# Patient Record
Sex: Female | Born: 1960 | Race: Black or African American | Hispanic: No | Marital: Single | Smoking: Current every day smoker
Health system: Southern US, Community
[De-identification: ages and names within clinical notes are randomized; demographics above are authoritative.]

## PROBLEM LIST (undated history)

## (undated) DIAGNOSIS — I10 Essential (primary) hypertension: Secondary | ICD-10-CM

---

## 2007-08-19 ENCOUNTER — Ambulatory Visit (HOSPITAL_BASED_OUTPATIENT_CLINIC_OR_DEPARTMENT_OTHER): Admission: RE | Admit: 2007-08-19 | Discharge: 2007-08-19 | Payer: Self-pay | Admitting: *Deleted

## 2007-08-19 ENCOUNTER — Encounter (INDEPENDENT_AMBULATORY_CARE_PROVIDER_SITE_OTHER): Payer: Self-pay | Admitting: *Deleted

## 2010-09-02 ENCOUNTER — Emergency Department (HOSPITAL_COMMUNITY): Admission: EM | Admit: 2010-09-02 | Discharge: 2010-09-02 | Payer: Self-pay | Admitting: Emergency Medicine

## 2010-09-05 ENCOUNTER — Emergency Department (HOSPITAL_COMMUNITY): Admission: EM | Admit: 2010-09-05 | Discharge: 2010-09-05 | Payer: Self-pay | Admitting: Emergency Medicine

## 2011-01-15 LAB — CBC
MCH: 28.4 pg (ref 26.0–34.0)
MCHC: 33.8 g/dL (ref 30.0–36.0)
MCV: 84.2 fL (ref 78.0–100.0)
Platelets: 260 10*3/uL (ref 150–400)
RBC: 4.62 MIL/uL (ref 3.87–5.11)

## 2011-01-15 LAB — URINALYSIS, ROUTINE W REFLEX MICROSCOPIC
Bilirubin Urine: NEGATIVE
Ketones, ur: NEGATIVE mg/dL
Nitrite: NEGATIVE
Protein, ur: NEGATIVE mg/dL
pH: 6 (ref 5.0–8.0)

## 2011-01-15 LAB — DIFFERENTIAL
Basophils Relative: 1 % (ref 0–1)
Eosinophils Absolute: 0.1 10*3/uL (ref 0.0–0.7)
Eosinophils Relative: 2 % (ref 0–5)
Lymphs Abs: 2.9 10*3/uL (ref 0.7–4.0)
Monocytes Relative: 5 % (ref 3–12)

## 2011-01-15 LAB — POCT I-STAT, CHEM 8
Creatinine, Ser: 0.9 mg/dL (ref 0.4–1.2)
Glucose, Bld: 105 mg/dL — ABNORMAL HIGH (ref 70–99)
HCT: 42 % (ref 36.0–46.0)
Hemoglobin: 14.3 g/dL (ref 12.0–15.0)
Sodium: 139 mEq/L (ref 135–145)
TCO2: 26 mmol/L (ref 0–100)

## 2011-03-19 NOTE — Op Note (Signed)
NAMELAEL, WETHERBEE                 ACCOUNT NO.:  [id number]   MEDICAL RECORD NO.:  [id number]          PATIENT TYPE:  AMB   LOCATION:  DSC                          FACILITY:  [facility]   PHYSICIAN:  Tennis Must Meyerdierks, M.D.DATE OF BIRTH:  1961-06-29   DATE OF PROCEDURE:  08/19/2007  DATE OF DISCHARGE:  08/19/2007                               OPERATIVE REPORT   PREOPERATIVE DIAGNOSIS:  Mass, right thumb.   POSTOPERATIVE DIAGNOSIS:  Mass, right thumb.   PROCEDURE:  Excision of mass, right thumb.   SURGEON:  Lowell Bouton, M.D.   ANESTHESIA:  General.   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  There was some erosion through the skin ulnarly but no gross purulent  material was obtained there.   PROCEDURE IN DETAIL:  Under general anesthesia, with a tourniquet on the  right arm, the right hand was prepped and draped in usual fashion.  After exsanguinating the limb, the tourniquet was inflated to 250 mmHg.  A Y-shaped incision was made over the dorsum of the IP joint and carried  down through the subcutaneous tissues.  Bleeding points were coagulated.  Sharp dissection was carried down to the tumor which was encapsulated.  It was sharply dissected out away from the extensor tendon and the  joint.  The joint was not entered.  After dissecting it out ulnarly,  the radial half of it was dissected out.  The ulnar side was where the  opening was that appeared to be purulent; however, there was no gross  infection.  After completely excising the mass, the wound was irrigated  with saline.  The skin was closed with 4-0 nylon sutures.  Marcaine,  0.5%, was inserted in a digital block for pain control.  Sterile  dressings were applied.  The patient went to the recovery room awake, in  stable and good condition.      Lowell Bouton, M.D.  Electronically  Signed     EMM/MEDQ  D:  08/19/2007  T:  08/20/2007  Job:  [id number]   cc:   Candyce Churn. Allyne Gee, M.D.

## 2011-08-15 LAB — BASIC METABOLIC PANEL
BUN: 7
CO2: 24
Chloride: 105
Creatinine, Ser: 0.83
Glucose, Bld: 111 — ABNORMAL HIGH

## 2011-09-11 ENCOUNTER — Other Ambulatory Visit: Payer: Self-pay | Admitting: Internal Medicine

## 2011-09-11 DIAGNOSIS — Z1231 Encounter for screening mammogram for malignant neoplasm of breast: Secondary | ICD-10-CM

## 2011-10-07 ENCOUNTER — Ambulatory Visit
Admission: RE | Admit: 2011-10-07 | Discharge: 2011-10-07 | Disposition: A | Discharge status: Home / Self Care | Payer: BC Managed Care – PPO | Source: Ambulatory Visit | Attending: Internal Medicine | Admitting: Internal Medicine

## 2011-10-07 DIAGNOSIS — Z1231 Encounter for screening mammogram for malignant neoplasm of breast: Secondary | ICD-10-CM

## 2012-03-12 ENCOUNTER — Emergency Department (HOSPITAL_COMMUNITY)
Admission: EM | Admit: 2012-03-12 | Discharge: 2012-03-12 | Disposition: A | Discharge status: Home / Self Care | Payer: Self-pay | Attending: Emergency Medicine | Admitting: Emergency Medicine

## 2012-03-12 ENCOUNTER — Emergency Department (HOSPITAL_COMMUNITY): Payer: Self-pay

## 2012-03-12 ENCOUNTER — Encounter (HOSPITAL_COMMUNITY): Payer: Self-pay

## 2012-03-12 DIAGNOSIS — M549 Dorsalgia, unspecified: Secondary | ICD-10-CM | POA: Insufficient documentation

## 2012-03-12 DIAGNOSIS — R10819 Abdominal tenderness, unspecified site: Secondary | ICD-10-CM | POA: Insufficient documentation

## 2012-03-12 DIAGNOSIS — M545 Low back pain, unspecified: Secondary | ICD-10-CM | POA: Insufficient documentation

## 2012-03-12 DIAGNOSIS — I10 Essential (primary) hypertension: Secondary | ICD-10-CM | POA: Insufficient documentation

## 2012-03-12 DIAGNOSIS — R109 Unspecified abdominal pain: Secondary | ICD-10-CM | POA: Insufficient documentation

## 2012-03-12 DIAGNOSIS — K8020 Calculus of gallbladder without cholecystitis without obstruction: Secondary | ICD-10-CM | POA: Insufficient documentation

## 2012-03-12 HISTORY — DX: Essential (primary) hypertension: I10

## 2012-03-12 LAB — CBC
MCH: 26.4 pg (ref 26.0–34.0)
MCHC: 32.6 g/dL (ref 30.0–36.0)
MCV: 80.9 fL (ref 78.0–100.0)
Platelets: 263 10*3/uL (ref 150–400)
RDW: 15.6 % — ABNORMAL HIGH (ref 11.5–15.5)

## 2012-03-12 LAB — WET PREP, GENITAL: Trich, Wet Prep: NONE SEEN

## 2012-03-12 LAB — URINALYSIS, ROUTINE W REFLEX MICROSCOPIC
Bilirubin Urine: NEGATIVE
Leukocytes, UA: NEGATIVE
Nitrite: NEGATIVE
Specific Gravity, Urine: 1.01 (ref 1.005–1.030)
pH: 6 (ref 5.0–8.0)

## 2012-03-12 LAB — COMPREHENSIVE METABOLIC PANEL
ALT: 9 U/L (ref 0–35)
Calcium: 9.6 mg/dL (ref 8.4–10.5)
GFR calc Af Amer: 72 mL/min — ABNORMAL LOW (ref >90)
Glucose, Bld: 82 mg/dL (ref 70–99)
Sodium: 139 mEq/L (ref 135–145)
Total Protein: 7 g/dL (ref 6.0–8.3)

## 2012-03-12 LAB — DIFFERENTIAL
Basophils Absolute: 0 10*3/uL (ref 0.0–0.1)
Basophils Relative: 0 % (ref 0–1)
Eosinophils Absolute: 0.1 10*3/uL (ref 0.0–0.7)
Eosinophils Relative: 1 % (ref 0–5)
Lymphs Abs: 3.1 10*3/uL (ref 0.7–4.0)

## 2012-03-12 MED ORDER — IBUPROFEN 600 MG PO TABS: 600.0000 mg | ORAL_TABLET | Freq: Four times a day (QID) | ORAL | Status: AC | PRN

## 2012-03-12 NOTE — ED Notes (Signed)
Pt compalins of lower back pain ongoing and progressively getting worse, sts had sex Monday night and that may have caused it.

## 2012-03-12 NOTE — ED Provider Notes (Signed)
History   This chart was scribed for [name], MD by [name]. The patient was seen in room STRE7/STRE7 and the patient's care was started at 1:20PM.    CSN: [id number]  Arrival date & time 03/12/12  1220   None     Chief Complaint  Patient presents with  . Back Pain    (Consider location/radiation/quality/duration/timing/severity/associated sxs/prior treatment) HPI  Alyssa Lofts, RN 03/12/2012 12:26  Pt compalins of lower back pain ongoing and progressively getting worse, sts had sex Monday night and that may have caused it.   Alyssa Lozano is a 51 y.o. female who presents to the Emergency Department complaining of constant, moderate to severe lower back pain with an onset 2 days ago that is progressively getting worse. 3/10 currently with min radiation to RLQ/Rt flank. Pt states that she had sexual intercourse a day before onset which may have caused the present pain. No Hx of lower back pain; pt has never experienced like this before. Laying down in bed/being in bed for long periods of time such as during sleep aggravates the pain. Pt also stated that she is smelling a vaginal d/c, but not seeing it. Abd pain present. No urinary problems. No HA, fever, neck pain, sore throat, rash, CP, SOB, n/v/d, dysuria, or extremity pain, edema, weakness, numbness, or tingling. No urinary incont or retention. Hx of HTN. No known allergies. No other pertinent medical symptoms. Denies hematuria/dysuria/freq/urgency.   No h/o abd surgeries. No trauma.  Past Medical History  Diagnosis Date  . Hypertension     History reviewed. No pertinent past surgical history.  History reviewed. No pertinent family history.  History  Substance Use Topics  . Smoking status: Current Everyday Smoker  . Smokeless tobacco: Not on file  . Alcohol Use: No    OB History    Grav Para Term Preterm Abortions TAB SAB Ect Mult Living                  Review of Systems 10 Systems reviewed and all are  negative for acute change except as noted in the HPI.   Allergies  Review of patient's allergies indicates no known allergies.  Home Medications   Current Outpatient Rx  Name Route Sig Dispense Refill  . BC HEADACHE POWDER PO Oral Take 1 packet by mouth daily as needed. For pain    . BUPROPION HCL 75 MG PO TABS Oral Take 75 mg by mouth 2 (two) times daily.    Marland Kitchen LOSARTAN POTASSIUM-HCTZ 100-25 MG PO TABS Oral Take 1 tablet by mouth daily.    . IBUPROFEN 600 MG PO TABS Oral Take 1 tablet (600 mg total) by mouth every 6 (six) hours as needed for pain. 30 tablet 0    BP 148/83  Pulse 71  Temp(Src) 98.2 F (36.8 C) (Oral)  Resp 18  SpO2 98%  Physical Exam  Nursing note and vitals reviewed. Constitutional: She is oriented to person, place, and time. She appears well-developed and well-nourished. No distress.  HENT:  Head: Normocephalic and atraumatic.  Eyes: EOM are normal. Pupils are equal, round, and reactive to light.  Neck: Normal range of motion. Neck supple. No tracheal deviation present.  Cardiovascular: Normal rate, regular rhythm and normal heart sounds.  Exam reveals no gallop and no friction rub.   No murmur heard. Pulmonary/Chest: Effort normal and breath sounds normal. No respiratory distress. She has no wheezes. She has no rales.  Abdominal: Soft. There is tenderness (Mild suprapubic  tenderness).  Musculoskeletal: Normal range of motion. She exhibits no edema and no tenderness.       No cva tenderness; no ttp lumbar paraspinal tenderness  Neurological: She is alert and oriented to person, place, and time.  Skin: Skin is warm and dry. No rash noted.  Psychiatric: She has a normal mood and affect. Her behavior is normal.    ED Course  Procedures (including critical care time)  DIAGNOSTIC STUDIES: Oxygen Saturation is 98% on room air, normal by my interpretation.    COORDINATION OF CARE:  1:24PM - EDMD will order abd CT for the pt; refuses pain meds; EDMD will  also perform pelvic exam 2:10PM - EDMD performed the pelvic exam   Labs Reviewed  CBC - Abnormal; Notable for the following:    Hemoglobin 11.2 (*)    HCT 34.4 (*)    RDW 15.6 (*)    All other components within normal limits  COMPREHENSIVE METABOLIC PANEL - Abnormal; Notable for the following:    Alkaline Phosphatase 126 (*)    Total Bilirubin 0.2 (*)    GFR calc non Af Amer 62 (*)    GFR calc Af Amer 72 (*)    All other components within normal limits  WET PREP, GENITAL - Abnormal; Notable for the following:    Clue Cells Wet Prep HPF POC FEW (*)    WBC, Wet Prep HPF POC FEW (*)    All other components within normal limits  DIFFERENTIAL  URINALYSIS, ROUTINE W REFLEX MICROSCOPIC  GC/CHLAMYDIA PROBE AMP, GENITAL   Ct Abdomen Pelvis Wo Contrast  03/12/2012  *RADIOLOGY REPORT*  Clinical Data: Moderate to severe low back pain.  Onset 2 days ago.  CT ABDOMEN AND PELVIS WITHOUT CONTRAST  Technique:  Multidetector CT imaging of the abdomen and pelvis was performed following the standard protocol without intravenous contrast.  Comparison: None.  Findings: Lung bases show dependent atelectasis.  Noncontrast appearance of the liver is within normal limits.  The ureters appear within normal limits.  There are no left renal calculi.  The left adrenal gland shows a small adenoma best seen on coronal imaging.  The right adrenal gland appears normal.  Nonobstructing 3 mm right inferior pole renal collecting system calculus.  Aorto iliofemoral atherosclerosis.  Uterus and adnexa appear normal. Urinary bladder normal.  Cholelithiasis is present.  In the tip of the gallbladder fundus, there are circumferential calcifications.  This may represent a peripherally calcified gallstone, particularly in the setting of other stones however calcified gallbladder neoplasm is not excluded.  There is no hepatic invasion.  There is no CT evidence of acute cholecystitis.  Normal appendix identified.  Prominent stool is  present in the ascending colon.  Urinary bladder appears normal.  No adenopathy.  No aggressive osseous lesions are identified. L4-L5 facet arthrosis with borderline anterolisthesis.  IMPRESSION: 1.  No acute abnormality. 2.  Nonobstructing right inferior pole 3 mm renal calculus. No ureteral calculi or hydronephrosis. 3.  Cholelithiasis. 4.  Calcification in the inferior gallbladder fundus likely represents calcified gallstone.  Porcelain gallbladder / gallbladder neoplasm cannot be not excluded. Follow up right upper quadrant ultrasound is recommended to assess for mobility or surgery consultation for cholecystectomy.  Discussed with Dr. Hyman Hopes. 5.  Small left adrenal adenoma.  Original Report Authenticated By: Andreas Newport, M.D.    1. Back pain   2. Flank pain   3. Gallstone     MDM  Back pain with radiation to RLQ/Rt flank. No reproducible on  exam. Neurovasc intact. No concern for cauda equina.  Non obstructive renal stones noted. She has calcificed gallstone and will have outpatient U/S to evaluate for porc gallbladder to assess her risk for cancer. She has been given referral to general surgery. She has no RUQ pain. Appears well +clue cells on wet prep but no itching, therefore will not treat. Gc/chl culture pending. Declining pain medication. Will f/u with her primary care doctor, f/u U/S GB and general surgery as needed. No EMC precluding discharge at this time. Given Precautions for return.    I personally performed the services described in this documentation, which was scribed in my presence. The recorded information has been reviewed and considered.          [name], MD 03/12/12 1510

## 2012-03-12 NOTE — Discharge Instructions (Signed)
Follow up with your doctor as discussed. You will need an outpatient ultrasound of your gallbladder to assess your risk for cancer. If for any reason you have difficulty obtaining your ultrasound you may call for appointment, otherwise, the department should call you as outpatient to schedule. Return for worsening pain or if you are concerned.  Back Pain, Adult Low back pain is very common. About 1 in 5 people have back pain.The cause of low back pain is rarely dangerous. The pain often gets better over time.About half of people with a sudden onset of back pain feel better in just 2 weeks. About 8 in 10 people feel better by 6 weeks.  CAUSES Some common causes of back pain include:  Strain of the muscles or ligaments supporting the spine.   Wear and tear (degeneration) of the spinal discs.   Arthritis.   Direct injury to the back.  DIAGNOSIS Most of the time, the direct cause of low back pain is not known.However, back pain can be treated effectively even when the exact cause of the pain is unknown.Answering your caregiver's questions about your overall health and symptoms is one of the most accurate ways to make sure the cause of your pain is not dangerous. If your caregiver needs more information, he or she may order lab work or imaging tests (X-rays or MRIs).However, even if imaging tests show changes in your back, this usually does not require surgery. HOME CARE INSTRUCTIONS For many people, back pain returns.Since low back pain is rarely dangerous, it is often a condition that people can learn to [facility] their own.   Remain active. It is stressful on the back to sit or stand in one place. Do not sit, drive, or stand in one place for more than 30 minutes at a time. Take short walks on level surfaces as soon as pain allows.Try to increase the length of time you walk each day.   Do not stay in bed.Resting more than 1 or 2 days can delay your recovery.   Do not avoid exercise or  work.Your body is made to move.It is not dangerous to be active, even though your back may hurt.Your back will likely heal faster if you return to being active before your pain is gone.   Pay attention to your body when you bend and lift. Many people have less discomfortwhen lifting if they bend their knees, keep the load close to their bodies,and avoid twisting. Often, the most comfortable positions are those that put less stress on your recovering back.   Find a comfortable position to sleep. Use a firm mattress and lie on your side with your knees slightly bent. If you lie on your back, put a pillow under your knees.   Only take over-the-counter or prescription medicines as directed by your caregiver. Over-the-counter medicines to reduce pain and inflammation are often the most helpful.Your caregiver may prescribe muscle relaxant drugs.These medicines help dull your pain so you can more quickly return to your normal activities and healthy exercise.   Put ice on the injured area.   Put ice in a plastic bag.   Place a towel between your skin and the bag.   Leave the ice on for 15 to 20 minutes, 3 to 4 times a day for the first 2 to 3 days. After that, ice and heat may be alternated to reduce pain and spasms.   Ask your caregiver about trying back exercises and gentle massage. This may be of some  benefit.   Avoid feeling anxious or stressed.Stress increases muscle tension and can worsen back pain.It is important to recognize when you are anxious or stressed and learn ways to manage it.Exercise is a great option.  SEEK MEDICAL CARE IF:  You have pain that is not relieved with rest or medicine.   You have pain that does not improve in 1 week.   You have new symptoms.   You are generally not feeling well.  SEEK IMMEDIATE MEDICAL CARE IF:   You have pain that radiates from your back into your legs.   You develop new bowel or bladder control problems.   You have unusual  weakness or numbness in your arms or legs.   You develop nausea or vomiting.   You develop abdominal pain.   You feel faint.  Document Released: 10/21/2005 Document Revised: 10/10/2011 Document Reviewed: 03/11/2011 [facility] Patient Information 2012 [place], [place].  RESOURCE GUIDE  Dental Problems  Patients with Medicaid: [facility] [phone]                                           [phone] [address] Phone:  [phone]                                                   Phone:  [phone]  If unable to pay or uninsured, contact:  [facility] or [facility]. to become qualified for the adult dental clinic.  Chronic Pain Problems Contact [facility]  [phone] Patients need to be referred by their primary care doctor.  Insufficient Money for Medicine Contact [employer] Way:  call "211" or [facility] [phone].  No Primary Care Doctor Call [facility]  [phone] Other agencies that provide inexpensive medical care    [facility]  [phone]    [facility]  [phone]    [facility]  [phone]    [facility]  [phone]    [facility]  [phone]    [facility]  [phone]  Psychological Services [facility]  [phone] [facility]  [phone] [facility]   [phone] (emergency services [phone])  Abuse/Neglect [facility] Child Abuse Hotline 2795967458 [facility] Child Abuse Hotline 743 824 3118 (After Hours)  Emergency Shelter [facility] [phone]  Maternity Homes [facility] [phone] [facility] [phone]  MRSA Hotline #:   801-550-9772    [facility] Resources  [facility]  United [employer]                           [facility] Dept. [address] [place]                      [address]         [address]  [address]  Cristobal Goldmann Phone:  [phone]                                  Phone:  [phone]                   Phone:  [phone]  [facility] Phone:  [phone]  [facility] Child Abuse Hotline 712 794 5037 [phone] (After Hours)

## 2012-03-13 LAB — GC/CHLAMYDIA PROBE AMP, GENITAL: Chlamydia, DNA Probe: NEGATIVE

## 2013-02-02 IMAGING — CT CT ABD-PELV W/O CM
2 of 4 series · 14 of 32 positions shown, 19 images · non-contrast
Comparison: None.

CLINICAL DATA: Moderate to severe low back pain.  Onset 2 days ago.

CT ABDOMEN AND PELVIS WITHOUT CONTRAST
TECHNIQUE: Multidetector CT imaging of the abdomen and pelvis was
performed following the standard protocol without intravenous
contrast.

[Series 2: renal stone · axial · 0.80mm/px · z∈[-393,-78]mm · 7 of 85 slices shown, 12 images]
[im 11/85  soft-tissue]
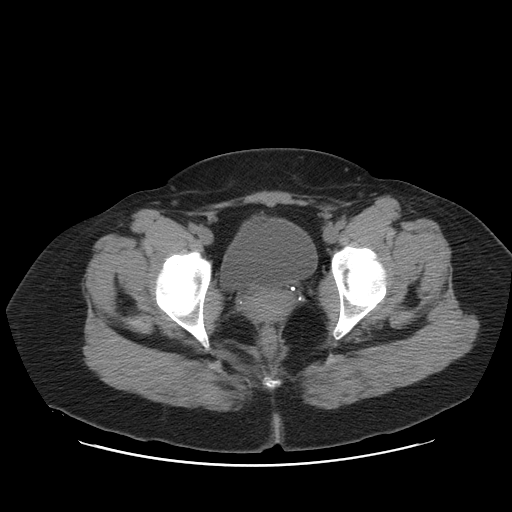
[im 11/85  bone]
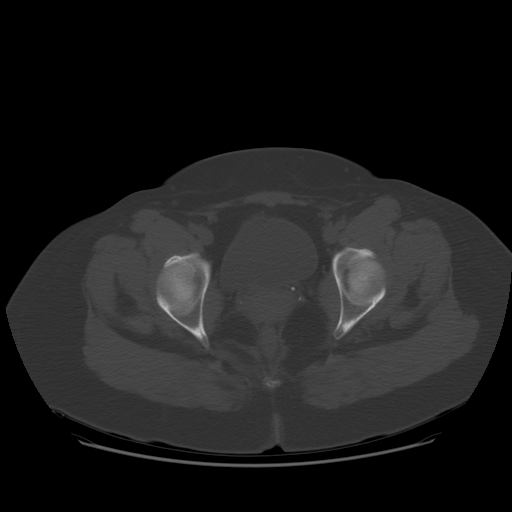
[im 22/85  soft-tissue]
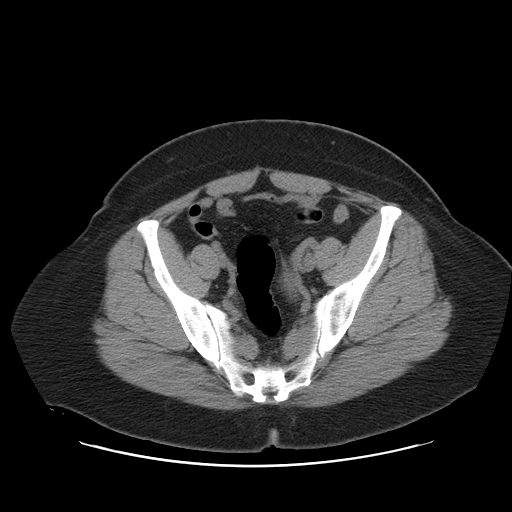
[im 32/85  soft-tissue]
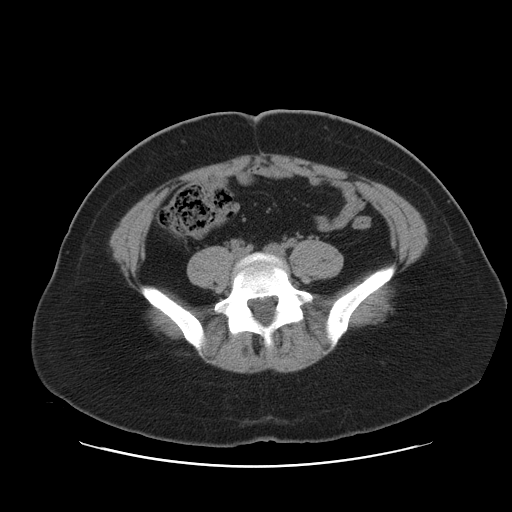
[im 43/85  soft-tissue]
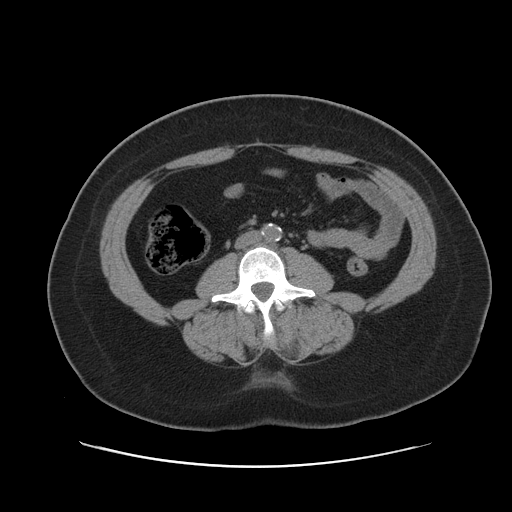
[im 43/85  lung]
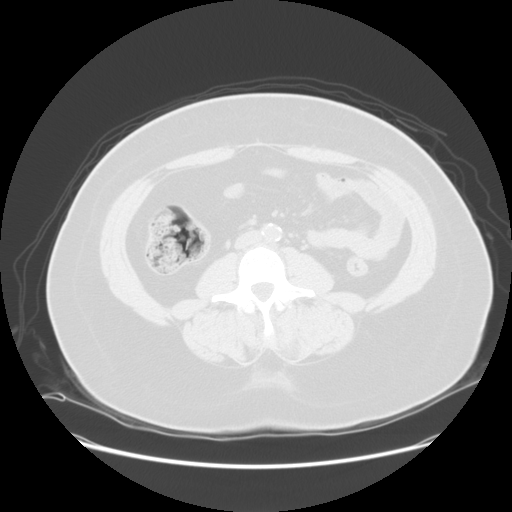
[im 53/85  soft-tissue]
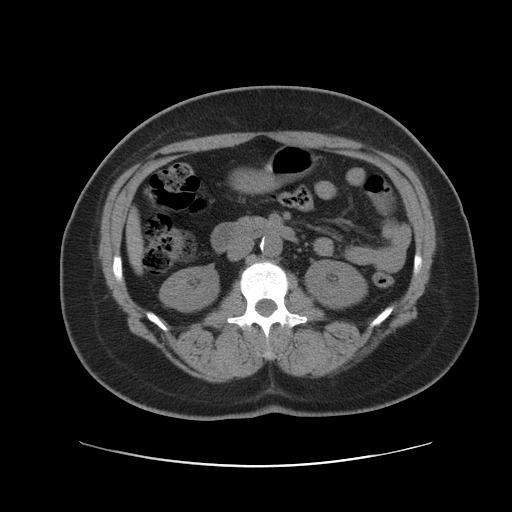
[im 53/85  lung]
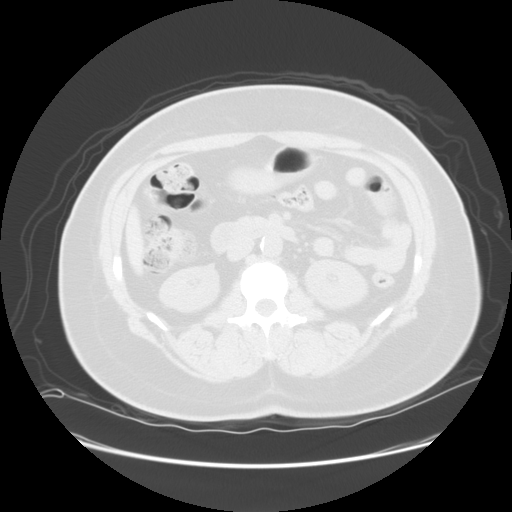
[im 64/85  soft-tissue]
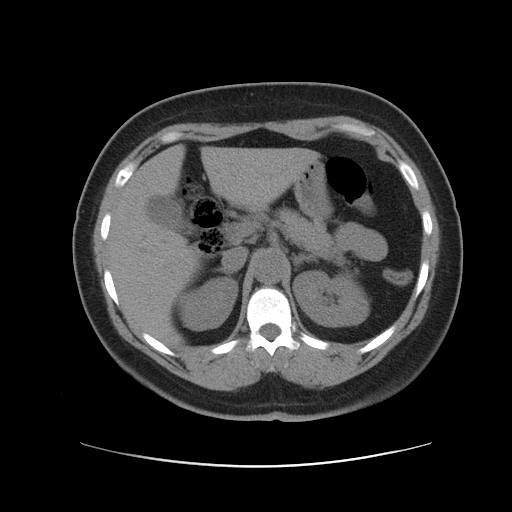
[im 64/85  lung]
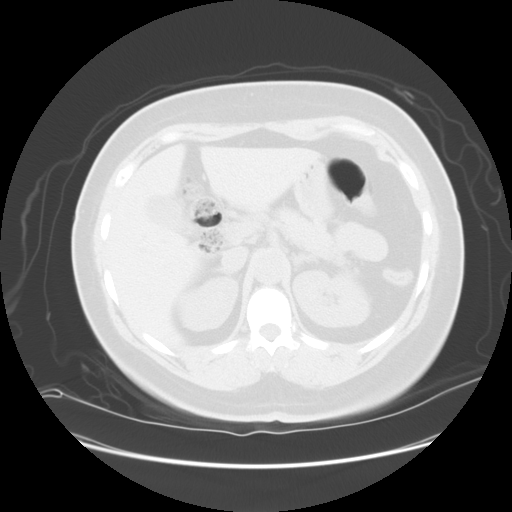
[im 74/85  soft-tissue]
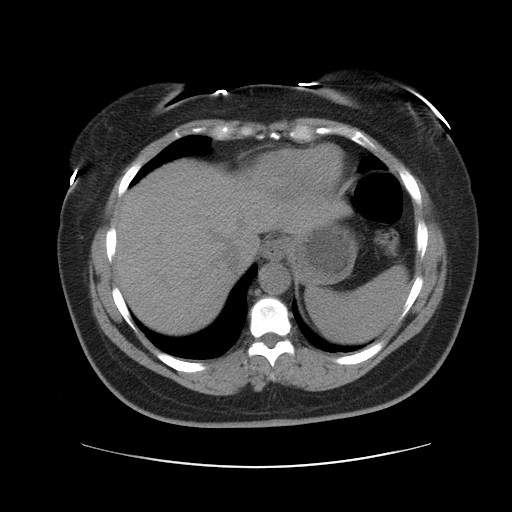
[im 74/85  lung]
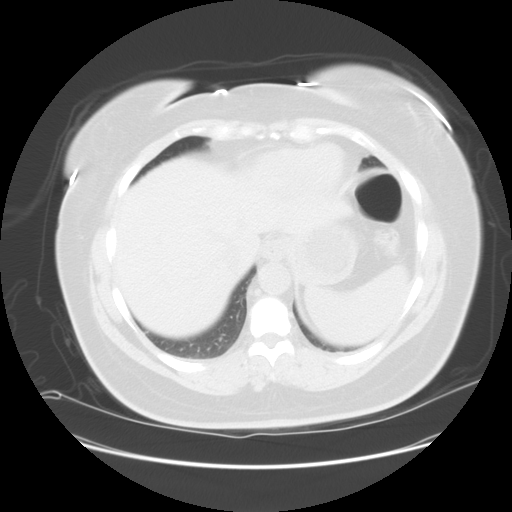

[Series 401: sag · sagittal · 0.89mm/px · 7 of 100 slices shown]
[im 10/100  soft-tissue]
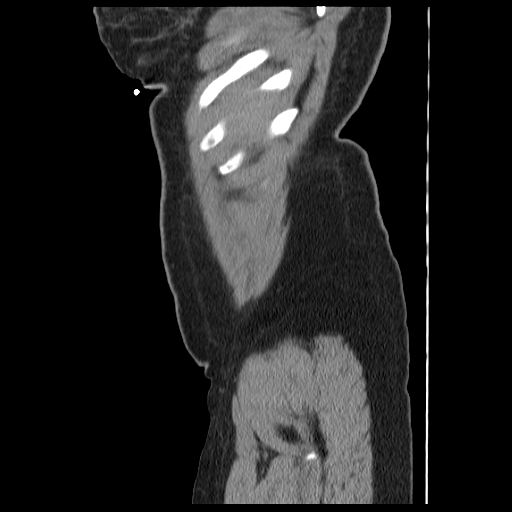
[im 20/100  soft-tissue]
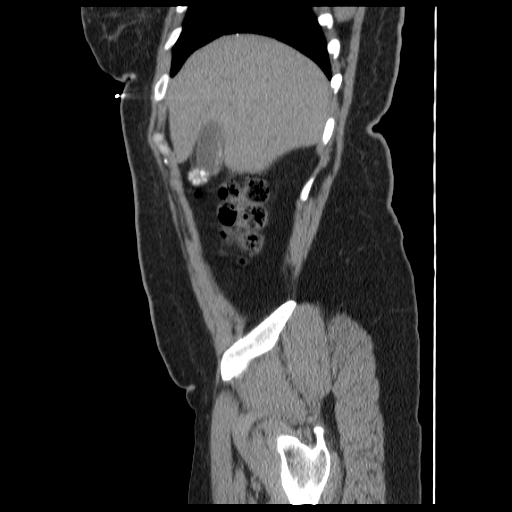
[im 30/100  soft-tissue]
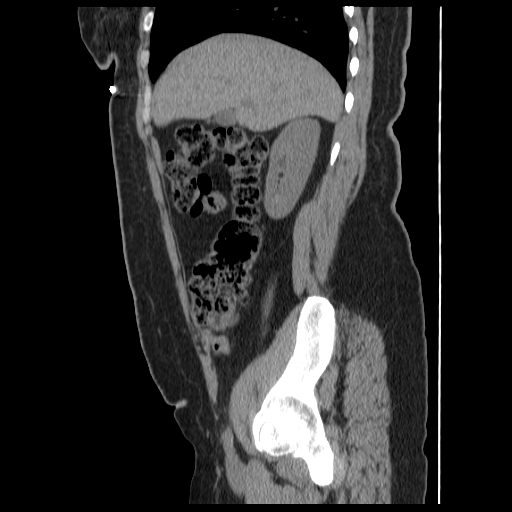
[im 40/100  soft-tissue]
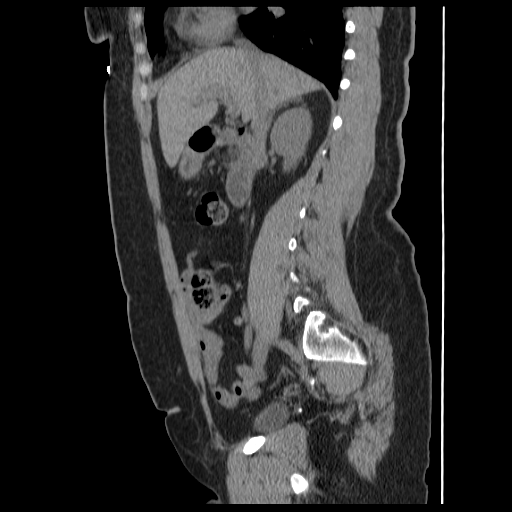
[im 60/100  soft-tissue]
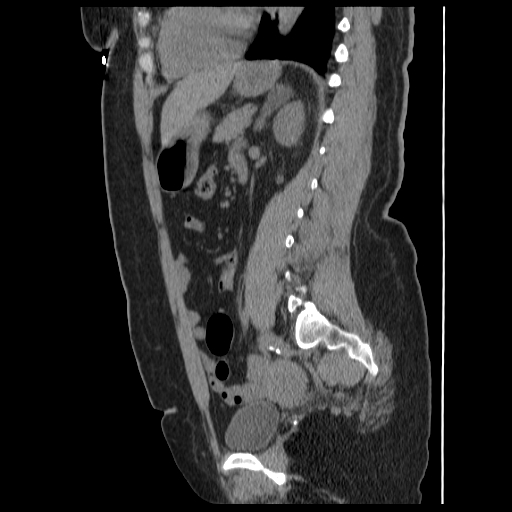
[im 70/100  soft-tissue]
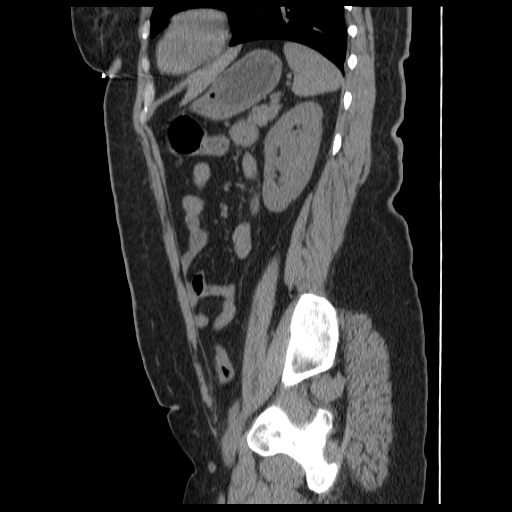
[im 80/100  soft-tissue]
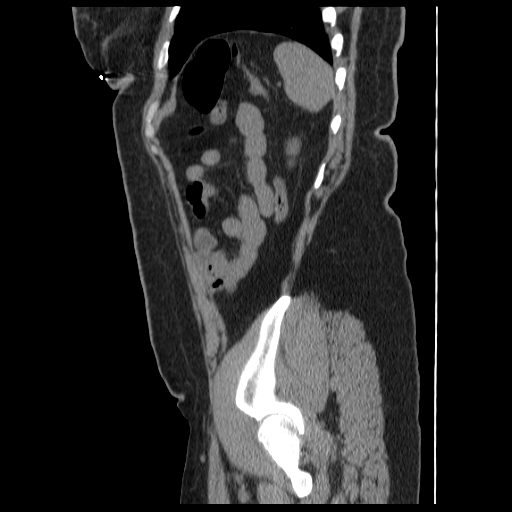

[14 of 32 positions shown; findings below may reference images not displayed]

FINDINGS: Lung bases show dependent atelectasis.  Noncontrast
appearance of the liver is within normal limits.  The ureters
appear within normal limits.  There are no left renal calculi.  The
left adrenal gland shows a small adenoma best seen on coronal
imaging.  The right adrenal gland appears normal.  Nonobstructing 3
mm right inferior pole renal collecting system calculus.  Aorto
iliofemoral atherosclerosis.  Uterus and adnexa appear normal.
Urinary bladder normal.

Cholelithiasis is present.  In the tip of the gallbladder fundus,
there are circumferential calcifications.  This may represent a
peripherally calcified gallstone, particularly in the setting of
other stones however calcified gallbladder neoplasm is not
excluded.  There is no hepatic invasion.  There is no CT evidence
of acute cholecystitis.  Normal appendix identified.  Prominent
stool is present in the ascending colon.  Urinary bladder appears
normal.  No adenopathy.  No aggressive osseous lesions are
identified. L4-L5 facet arthrosis with borderline anterolisthesis.
IMPRESSION: 1.  No acute abnormality.
2.  Nonobstructing right inferior pole 3 mm renal calculus. No
ureteral calculi or hydronephrosis.
3.  Cholelithiasis.
4.  Calcification in the inferior gallbladder fundus likely
represents calcified gallstone.  Porcelain gallbladder /
gallbladder neoplasm cannot be not excluded. Follow up right upper
quadrant ultrasound is recommended to assess for mobility or
surgery consultation for cholecystectomy.  Discussed with Dr. Andreitha.
5.  Small left adrenal adenoma.

## 2015-05-06 ENCOUNTER — Ambulatory Visit (INDEPENDENT_AMBULATORY_CARE_PROVIDER_SITE_OTHER): Payer: BLUE CROSS/BLUE SHIELD | Admitting: Physician Assistant

## 2015-05-06 VITALS — BP 162/98 | HR 81 | Temp 98.2°F | Resp 17 | Ht 66.0 in | Wt 184.6 lb

## 2015-05-06 DIAGNOSIS — IMO0001 Reserved for inherently not codable concepts without codable children: Secondary | ICD-10-CM

## 2015-05-06 DIAGNOSIS — R030 Elevated blood-pressure reading, without diagnosis of hypertension: Secondary | ICD-10-CM

## 2015-05-06 DIAGNOSIS — M545 Low back pain, unspecified: Secondary | ICD-10-CM

## 2015-05-06 DIAGNOSIS — R319 Hematuria, unspecified: Secondary | ICD-10-CM

## 2015-05-06 DIAGNOSIS — Z8679 Personal history of other diseases of the circulatory system: Secondary | ICD-10-CM

## 2015-05-06 LAB — POCT URINALYSIS DIPSTICK
BILIRUBIN UA: NEGATIVE
GLUCOSE UA: NEGATIVE
Ketones, UA: NEGATIVE
Leukocytes, UA: NEGATIVE
Nitrite, UA: NEGATIVE
Spec Grav, UA: >=1.03
UROBILINOGEN UA: 0.2
pH, UA: 5.5

## 2015-05-06 LAB — POCT UA - MICROSCOPIC ONLY
Casts, Ur, LPF, POC: NEGATIVE
Crystals, Ur, HPF, POC: NEGATIVE
Yeast, UA: NEGATIVE

## 2015-05-06 MED ORDER — TRAMADOL-ACETAMINOPHEN 37.5-325 MG PO TABS: 1.0000 | ORAL_TABLET | Freq: Four times a day (QID) | ORAL | Status: DC | PRN

## 2015-05-06 MED ORDER — CYCLOBENZAPRINE HCL 10 MG PO TABS: 10.0000 mg | ORAL_TABLET | Freq: Three times a day (TID) | ORAL | Status: DC | PRN

## 2015-05-06 MED ORDER — LOSARTAN POTASSIUM-HCTZ 100-25 MG PO TABS: 1.0000 | ORAL_TABLET | Freq: Every day | ORAL | Status: DC

## 2015-05-06 NOTE — Progress Notes (Addendum)
05/06/2015 at 4:33 PM  Alyssa Lozano / DOB: [birth year] / MRN: [id number]  The patient  does not have a problem list on file.  SUBJECTIVE  Chief complaint: Abdominal Pain; Back Pain; Constipation; and Hypertension  Patient here for back pain that started this morning.  The pain is in the middle of the low back and sharp.  She denies a history of back injury and has not done any heavy lifting. She has no dysuria, frequency, and urgency.  She denies a radicular pattern to the pain and the pain is worse when she is going from sitting to standing or lying to sitting. The pain is not waking her up at night.   She reports feeling constipated, and reports it took her longer than normal to move her bowels this morning than usual and she also reports a poor bowel movement with regard to quantity of stool.   She has a history of hypertension and has not been receiving her regular medication for this problem.  She would like a thirty day refill at this time and will make an appointment with her PCP.   She  has a past medical history of Hypertension.    Medications reviewed and updated by myself where necessary, and exist elsewhere in the encounter.   Alyssa Lozano has No Known Allergies. She  reports that she has been smoking.  She does not have any smokeless tobacco history on file. She reports that she does not drink alcohol or use illicit drugs. She  has no sexual activity history on file. The patient  has no past surgical history on file.  Her family history includes Hypertension in her father and mother.  Review of Systems  Constitutional: Negative for fever and chills.  Respiratory: Negative for cough, shortness of breath and wheezing.   Cardiovascular: Negative for chest pain and palpitations.  Gastrointestinal: Negative for nausea.  Genitourinary: Negative for dysuria, urgency, frequency, hematuria and flank pain.  Musculoskeletal: Positive for back pain.  Skin: Negative for itching and rash.    Neurological: Negative for dizziness and headaches.    OBJECTIVE  Her  height is  (1.676 m) and weight is 184 lb 9.6 oz (83.734 kg). Her oral temperature is 98.2 F (36.8 C). Her blood pressure is 162/98 and her pulse is 81. Her respiration is 17 and oxygen saturation is 98%.  The patient's body mass index is 29.81 kg/(m^2).  Physical Exam  Constitutional: She is oriented to person, place, and time. She appears well-developed and well-nourished. No distress.  Cardiovascular: Normal rate, regular rhythm, normal heart sounds and intact distal pulses.  Exam reveals no gallop and no friction rub.   No murmur heard. Respiratory: Effort normal and breath sounds normal. She has no wheezes. She has no rales.  GI: Soft. Bowel sounds are normal. She exhibits no distension and no mass. There is no tenderness. There is no rebound and no guarding.  Musculoskeletal: Normal range of motion.       Lumbar back: She exhibits pain and spasm.       Back:  Neurological: She is alert and oriented to person, place, and time. She has normal strength. She displays no tremor. No cranial nerve deficit or sensory deficit. She exhibits normal muscle tone. Coordination and gait normal.  Reflex Scores:      Patellar reflexes are 2+ on the right side and 2+ on the left side.      Achilles reflexes are 2+ on the right side and  2+ on the left side. Negative SLR bilaterally.   Skin: Skin is warm and dry. She is not diaphoretic.  Psychiatric: She has a normal mood and affect.    Results for orders placed or performed in visit on 05/06/15 (from the past 24 hour(s))  POCT urinalysis dipstick     Status: None   Collection Time: 05/06/15  4:11 PM  Result Value Ref Range   Color, UA yellow    Clarity, UA hazy    Glucose, UA neg    Bilirubin, UA neg    Ketones, UA neg    Spec Grav, UA >=1.030    Blood, UA trace    pH, UA 5.5    Protein, UA trace    Urobilinogen, UA 0.2    Nitrite, UA neg    Leukocytes, UA  Negative Negative  POCT UA - Microscopic Only     Status: None   Collection Time: 05/06/15  4:11 PM  Result Value Ref Range   WBC, Ur, HPF, POC 0-2    RBC, urine, microscopic 2-3    Bacteria, U Microscopic trace    Mucus, UA trace    Epithelial cells, urine per micros 2-3    Crystals, Ur, HPF, POC neg    Casts, Ur, LPF, POC neg    Yeast, UA neg     ASSESSMENT & PLAN  Alyssa Lozano was seen today for abdominal pain, back pain, constipation and hypertension.  Diagnoses and all orders for this visit:  Midline low back pain without sciatica Orders: -     POCT urinalysis dipstick -     POCT UA - Microscopic Only -     traMADol-acetaminophen (ULTRACET) 37.5-325 MG per tablet; Take 1-2 tablets by mouth every 6 (six) hours as needed. -     cyclobenzaprine (FLEXERIL) 10 MG tablet; Take 1 tablet (10 mg total) by mouth 3 (three) times daily as needed for muscle spasms.  Elevated BP Orders: -     losartan-hydrochlorothiazide (HYZAAR) 100-25 MG per tablet; Take 1 tablet by mouth daily. Please go back to your primary care for future refills.  History of hypertension Orders: -     losartan-hydrochlorothiazide (HYZAAR) 100-25 MG per tablet; Take 1 tablet by mouth daily. Please go back to your primary care for future refills.  Hematuria Orders: -     Urine culture    The patient was advised to call or come back to clinic if she does not see an improvement in symptoms, or worsens with the above plan.   Deliah BostonMichael Clark, MHS, PA-C [facility] 05/06/2015 4:33 PM

## 2015-05-06 NOTE — Addendum Note (Signed)
Addended by: Ofilia NeasLARK, MICHAEL L on: 05/06/2015 04:50 PM   Modules accepted: Kipp BroodSmartSet

## 2015-05-08 LAB — URINE CULTURE
COLONY COUNT: NO GROWTH
ORGANISM ID, BACTERIA: NO GROWTH

## 2015-05-10 NOTE — Progress Notes (Signed)
  Medical screening examination/treatment/procedure(s) were performed by non-physician practitioner and as supervising physician I was immediately available for consultation/collaboration.     

## 2015-05-10 NOTE — Addendum Note (Signed)
Addended by: Carmelina DaneANDERSON, Cande Mastropietro S on: 05/10/2015 08:49 AM   Modules accepted: Kipp BroodSmartSet

## 2015-06-21 ENCOUNTER — Other Ambulatory Visit: Payer: Self-pay

## 2015-06-21 DIAGNOSIS — Z1231 Encounter for screening mammogram for malignant neoplasm of breast: Secondary | ICD-10-CM

## 2015-06-28 ENCOUNTER — Ambulatory Visit
Admission: RE | Admit: 2015-06-28 | Discharge: 2015-06-28 | Disposition: A | Discharge status: Home / Self Care | Payer: Self-pay | Source: Ambulatory Visit

## 2015-06-28 DIAGNOSIS — Z1231 Encounter for screening mammogram for malignant neoplasm of breast: Secondary | ICD-10-CM

## 2016-02-07 DIAGNOSIS — I10 Essential (primary) hypertension: Secondary | ICD-10-CM | POA: Diagnosis not present

## 2016-02-07 DIAGNOSIS — E669 Obesity, unspecified: Secondary | ICD-10-CM | POA: Diagnosis not present

## 2016-02-07 DIAGNOSIS — F17290 Nicotine dependence, other tobacco product, uncomplicated: Secondary | ICD-10-CM | POA: Diagnosis not present

## 2016-02-07 DIAGNOSIS — Z6837 Body mass index (BMI) 37.0-37.9, adult: Secondary | ICD-10-CM | POA: Diagnosis not present

## 2016-05-09 DIAGNOSIS — Z79899 Other long term (current) drug therapy: Secondary | ICD-10-CM | POA: Diagnosis not present

## 2016-05-09 DIAGNOSIS — E1165 Type 2 diabetes mellitus with hyperglycemia: Secondary | ICD-10-CM | POA: Diagnosis not present

## 2016-05-09 DIAGNOSIS — Z6836 Body mass index (BMI) 36.0-36.9, adult: Secondary | ICD-10-CM | POA: Diagnosis not present

## 2016-05-09 DIAGNOSIS — I10 Essential (primary) hypertension: Secondary | ICD-10-CM | POA: Diagnosis not present

## 2016-08-15 ENCOUNTER — Other Ambulatory Visit: Payer: Self-pay | Admitting: Internal Medicine

## 2016-08-15 DIAGNOSIS — Z1231 Encounter for screening mammogram for malignant neoplasm of breast: Secondary | ICD-10-CM

## 2016-09-11 ENCOUNTER — Ambulatory Visit
Admission: RE | Admit: 2016-09-11 | Discharge: 2016-09-11 | Disposition: A | Discharge status: Home / Self Care | Payer: BLUE CROSS/BLUE SHIELD | Source: Ambulatory Visit | Attending: Internal Medicine | Admitting: Internal Medicine

## 2016-09-11 DIAGNOSIS — Z1231 Encounter for screening mammogram for malignant neoplasm of breast: Secondary | ICD-10-CM | POA: Diagnosis not present

## 2016-09-11 DIAGNOSIS — Z23 Encounter for immunization: Secondary | ICD-10-CM | POA: Diagnosis not present

## 2016-09-11 DIAGNOSIS — E1165 Type 2 diabetes mellitus with hyperglycemia: Secondary | ICD-10-CM | POA: Diagnosis not present

## 2016-09-11 DIAGNOSIS — L304 Erythema intertrigo: Secondary | ICD-10-CM | POA: Diagnosis not present

## 2016-09-11 DIAGNOSIS — I10 Essential (primary) hypertension: Secondary | ICD-10-CM | POA: Diagnosis not present

## 2016-09-11 DIAGNOSIS — F17200 Nicotine dependence, unspecified, uncomplicated: Secondary | ICD-10-CM | POA: Diagnosis not present

## 2016-10-11 DIAGNOSIS — J069 Acute upper respiratory infection, unspecified: Secondary | ICD-10-CM | POA: Diagnosis not present

## 2016-10-24 DIAGNOSIS — E1165 Type 2 diabetes mellitus with hyperglycemia: Secondary | ICD-10-CM | POA: Diagnosis not present

## 2016-10-24 DIAGNOSIS — Z79899 Other long term (current) drug therapy: Secondary | ICD-10-CM | POA: Diagnosis not present

## 2016-12-25 DIAGNOSIS — I10 Essential (primary) hypertension: Secondary | ICD-10-CM | POA: Diagnosis not present

## 2016-12-25 DIAGNOSIS — F17290 Nicotine dependence, other tobacco product, uncomplicated: Secondary | ICD-10-CM | POA: Diagnosis not present

## 2016-12-25 DIAGNOSIS — E1165 Type 2 diabetes mellitus with hyperglycemia: Secondary | ICD-10-CM | POA: Diagnosis not present

## 2016-12-25 DIAGNOSIS — Z6836 Body mass index (BMI) 36.0-36.9, adult: Secondary | ICD-10-CM | POA: Diagnosis not present

## 2017-01-28 DIAGNOSIS — Z1211 Encounter for screening for malignant neoplasm of colon: Secondary | ICD-10-CM | POA: Diagnosis not present

## 2017-03-25 DIAGNOSIS — Z0001 Encounter for general adult medical examination with abnormal findings: Secondary | ICD-10-CM | POA: Diagnosis not present

## 2017-03-25 DIAGNOSIS — E1165 Type 2 diabetes mellitus with hyperglycemia: Secondary | ICD-10-CM | POA: Diagnosis not present

## 2017-03-25 DIAGNOSIS — Z6836 Body mass index (BMI) 36.0-36.9, adult: Secondary | ICD-10-CM | POA: Diagnosis not present

## 2017-03-25 DIAGNOSIS — I10 Essential (primary) hypertension: Secondary | ICD-10-CM | POA: Diagnosis not present

## 2017-03-25 DIAGNOSIS — M65311 Trigger thumb, right thumb: Secondary | ICD-10-CM | POA: Diagnosis not present

## 2017-03-25 DIAGNOSIS — Z0000 Encounter for general adult medical examination without abnormal findings: Secondary | ICD-10-CM | POA: Diagnosis not present

## 2017-04-22 DIAGNOSIS — Z720 Tobacco use: Secondary | ICD-10-CM | POA: Diagnosis not present

## 2017-04-22 DIAGNOSIS — I10 Essential (primary) hypertension: Secondary | ICD-10-CM | POA: Diagnosis not present

## 2017-04-22 DIAGNOSIS — Z6835 Body mass index (BMI) 35.0-35.9, adult: Secondary | ICD-10-CM | POA: Diagnosis not present

## 2017-05-19 DIAGNOSIS — M79641 Pain in right hand: Secondary | ICD-10-CM | POA: Diagnosis not present

## 2017-05-19 DIAGNOSIS — M65311 Trigger thumb, right thumb: Secondary | ICD-10-CM | POA: Diagnosis not present

## 2017-07-30 DIAGNOSIS — Z23 Encounter for immunization: Secondary | ICD-10-CM | POA: Diagnosis not present

## 2017-07-30 DIAGNOSIS — Z6836 Body mass index (BMI) 36.0-36.9, adult: Secondary | ICD-10-CM | POA: Diagnosis not present

## 2017-07-30 DIAGNOSIS — Z79899 Other long term (current) drug therapy: Secondary | ICD-10-CM | POA: Diagnosis not present

## 2017-07-30 DIAGNOSIS — E1165 Type 2 diabetes mellitus with hyperglycemia: Secondary | ICD-10-CM | POA: Diagnosis not present

## 2017-07-30 DIAGNOSIS — I10 Essential (primary) hypertension: Secondary | ICD-10-CM | POA: Diagnosis not present

## 2017-12-03 DIAGNOSIS — I10 Essential (primary) hypertension: Secondary | ICD-10-CM | POA: Diagnosis not present

## 2017-12-03 DIAGNOSIS — E1165 Type 2 diabetes mellitus with hyperglycemia: Secondary | ICD-10-CM | POA: Diagnosis not present

## 2017-12-03 DIAGNOSIS — D649 Anemia, unspecified: Secondary | ICD-10-CM | POA: Diagnosis not present

## 2017-12-03 DIAGNOSIS — Z79899 Other long term (current) drug therapy: Secondary | ICD-10-CM | POA: Diagnosis not present

## 2017-12-03 DIAGNOSIS — F4321 Adjustment disorder with depressed mood: Secondary | ICD-10-CM | POA: Diagnosis not present

## 2018-03-02 DIAGNOSIS — I10 Essential (primary) hypertension: Secondary | ICD-10-CM | POA: Diagnosis not present

## 2018-03-02 DIAGNOSIS — E669 Obesity, unspecified: Secondary | ICD-10-CM | POA: Diagnosis not present

## 2018-03-02 DIAGNOSIS — Z1389 Encounter for screening for other disorder: Secondary | ICD-10-CM | POA: Diagnosis not present

## 2018-03-02 DIAGNOSIS — E1169 Type 2 diabetes mellitus with other specified complication: Secondary | ICD-10-CM | POA: Diagnosis not present

## 2018-03-02 DIAGNOSIS — Z6834 Body mass index (BMI) 34.0-34.9, adult: Secondary | ICD-10-CM | POA: Diagnosis not present

## 2018-05-20 DIAGNOSIS — E785 Hyperlipidemia, unspecified: Principal | ICD-10-CM

## 2018-05-20 DIAGNOSIS — E1169 Type 2 diabetes mellitus with other specified complication: Secondary | ICD-10-CM | POA: Insufficient documentation

## 2018-05-20 DIAGNOSIS — Z0000 Encounter for general adult medical examination without abnormal findings: Secondary | ICD-10-CM | POA: Insufficient documentation

## 2018-05-20 DIAGNOSIS — I10 Essential (primary) hypertension: Secondary | ICD-10-CM | POA: Diagnosis not present

## 2018-05-20 LAB — HEPATIC FUNCTION PANEL
ALT: 10 (ref 7–35)
AST: 16 (ref 13–35)
Alkaline Phosphatase: 121 (ref 25–125)
Bilirubin, Total: 0.2

## 2018-05-20 LAB — CBC AND DIFFERENTIAL
HCT: 35 — AB (ref 36–46)
Hemoglobin: 11.2 — AB (ref 12.0–16.0)
PLATELETS: 290 (ref 150–399)
WBC: 6.3

## 2018-05-20 LAB — BASIC METABOLIC PANEL
BUN: 10 (ref 4–21)
Creatinine: 1 (ref 0.5–1.1)
Glucose: 71
POTASSIUM: 4.6 (ref 3.4–5.3)
SODIUM: 140 (ref 137–147)

## 2018-05-20 LAB — LIPID PANEL
Cholesterol: 209 — AB (ref 0–200)
HDL: 44 (ref 35–70)
LDL CALC: 145
LDl/HDL Ratio: 3.3
TRIGLYCERIDES: 98 (ref 40–160)

## 2018-05-20 LAB — HEMOGLOBIN A1C: HEMOGLOBIN A1C: 6.1

## 2018-05-20 LAB — VITAMIN D 25 HYDROXY (VIT D DEFICIENCY, FRACTURES): Vit D, 25-Hydroxy: 17

## 2018-08-07 ENCOUNTER — Other Ambulatory Visit: Payer: Self-pay | Admitting: Nurse Practitioner

## 2018-08-22 ENCOUNTER — Encounter: Payer: Self-pay | Admitting: Nurse Practitioner

## 2018-08-22 ENCOUNTER — Encounter: Payer: Self-pay | Admitting: Internal Medicine

## 2018-08-22 DIAGNOSIS — E785 Hyperlipidemia, unspecified: Principal | ICD-10-CM

## 2018-08-22 DIAGNOSIS — E1169 Type 2 diabetes mellitus with other specified complication: Secondary | ICD-10-CM

## 2018-08-22 DIAGNOSIS — Z0000 Encounter for general adult medical examination without abnormal findings: Secondary | ICD-10-CM

## 2018-08-26 ENCOUNTER — Encounter: Payer: Self-pay | Admitting: Internal Medicine

## 2018-08-26 ENCOUNTER — Ambulatory Visit: Payer: BLUE CROSS/BLUE SHIELD | Admitting: Internal Medicine

## 2018-08-26 VITALS — BP 116/78 | HR 58 | Temp 98.2°F | Ht 59.5 in | Wt 173.4 lb

## 2018-08-26 DIAGNOSIS — M25561 Pain in right knee: Secondary | ICD-10-CM | POA: Diagnosis not present

## 2018-08-26 DIAGNOSIS — F17210 Nicotine dependence, cigarettes, uncomplicated: Secondary | ICD-10-CM | POA: Diagnosis not present

## 2018-08-26 DIAGNOSIS — G8929 Other chronic pain: Secondary | ICD-10-CM

## 2018-08-26 DIAGNOSIS — E1165 Type 2 diabetes mellitus with hyperglycemia: Secondary | ICD-10-CM | POA: Diagnosis not present

## 2018-08-26 DIAGNOSIS — I10 Essential (primary) hypertension: Secondary | ICD-10-CM

## 2018-08-26 DIAGNOSIS — Z23 Encounter for immunization: Secondary | ICD-10-CM | POA: Diagnosis not present

## 2018-08-26 DIAGNOSIS — Z1211 Encounter for screening for malignant neoplasm of colon: Secondary | ICD-10-CM

## 2018-08-26 MED ORDER — VARENICLINE TARTRATE 0.5 MG X 11 & 1 MG X 42 PO MISC
ORAL | 0 refills | Status: DC
Start: 2018-08-26 — End: 2019-02-10

## 2018-08-26 NOTE — Patient Instructions (Addendum)
Coping with Quitting Smoking Quitting smoking is a physical and mental challenge. You will face cravings, withdrawal symptoms, and temptation. Before quitting, work with your health care provider to make a plan that can help you cope. Preparation can help you quit and keep you from giving in. How can I cope with cravings? Cravings usually last for 5-10 minutes. If you get through it, the craving will pass. Consider taking the following actions to help you cope with cravings:  Keep your mouth busy: ? Chew sugar-free gum. ? Suck on hard candies or a straw. ? Brush your teeth.  Keep your hands and body busy: ? Immediately change to a different activity when you feel a craving. ? Squeeze or play with a ball. ? Do an activity or a hobby, like making bead jewelry, practicing needlepoint, or working with wood. ? Mix up your normal routine. ? Take a short exercise break. Go for a quick walk or run up and down stairs. ? Spend time in public places where smoking is not allowed.  Focus on doing something kind or helpful for someone else.  Call a friend or family member to talk during a craving.  Join a support group.  Call a quit line, such as 1-800-QUIT-NOW.  Talk with your health care provider about medicines that might help you cope with cravings and make quitting easier for you.  How can I deal with withdrawal symptoms? Your body may experience negative effects as it tries to get used to not having nicotine in the system. These effects are called withdrawal symptoms. They may include:  Feeling hungrier than normal.  Trouble concentrating.  Irritability.  Trouble sleeping.  Feeling depressed.  Restlessness and agitation.  Craving a cigarette.  To manage withdrawal symptoms:  Avoid places, people, and activities that trigger your cravings.  Remember why you want to quit.  Get plenty of sleep.  Avoid coffee and other caffeinated drinks. These may worsen some of your  symptoms.  How can I handle social situations? Social situations can be difficult when you are quitting smoking, especially in the first few weeks. To manage this, you can:  Avoid parties, bars, and other social situations where people might be smoking.  Avoid alcohol.  Leave right away if you have the urge to smoke.  Explain to your family and friends that you are quitting smoking. Ask for understanding and support.  Plan activities with friends or family where smoking is not an option.  What are some ways I can cope with stress? Wanting to smoke may cause stress, and stress can make you want to smoke. Find ways to manage your stress. Relaxation techniques can help. For example:  Breathe slowly and deeply, in through your nose and out through your mouth.  Listen to soothing, relaxing music.  Talk with a family member or friend about your stress.  Light a candle.  Soak in a bath or take a shower.  Think about a peaceful place.  What are some ways I can prevent weight gain? Be aware that many people gain weight after they quit smoking. However, not everyone does. To keep from gaining weight, have a plan in place before you quit and stick to the plan after you quit. Your plan should include:  Having healthy snacks. When you have a craving, it may help to: ? Eat plain popcorn, crunchy carrots, celery, or other cut vegetables. ? Chew sugar-free gum.  Changing how you eat: ? Eat small portion sizes at meals. ?   Eat 4-6 small meals throughout the day instead of 1-2 large meals a day. ? Be mindful when you eat. Do not watch television or do other things that might distract you as you eat.  Exercising regularly: ? Make time to exercise each day. If you do not have time for a long workout, do short bouts of exercise for 5-10 minutes several times a day. ? Do some form of strengthening exercise, like weight lifting, and some form of aerobic exercise, like running or  swimming.  Drinking plenty of water or other low-calorie or no-calorie drinks. Drink 6-8 glasses of water daily, or as much as instructed by your health care provider.  Summary  Quitting smoking is a physical and mental challenge. You will face cravings, withdrawal symptoms, and temptation to smoke again. Preparation can help you as you go through these challenges.  You can cope with cravings by keeping your mouth busy (such as by chewing gum), keeping your body and hands busy, and making calls to family, friends, or a helpline for people who want to quit smoking.  You can cope with withdrawal symptoms by avoiding places where people smoke, avoiding drinks with caffeine, and getting plenty of rest.  Ask your health care provider about the different ways to prevent weight gain, avoid stress, and handle social situations. This information is not intended to replace advice given to you by your health care provider. Make sure you discuss any questions you have with your health care provider. Document Released: 10/18/2016 Document Revised: 10/18/2016 Document Reviewed: 10/18/2016 Elsevier Interactive Patient Education  2018 Elsevier Inc. Varenicline oral tablets What is this medicine? VARENICLINE (var EN i kleen) is used to help people quit smoking. It can reduce the symptoms caused by stopping smoking. It is used with a patient support program recommended by your physician. This medicine may be used for other purposes; ask your health care provider or pharmacist if you have questions. COMMON BRAND NAME(S): Chantix What should I tell my health care provider before I take this medicine? They need to know if you have any of these conditions: -bipolar disorder, depression, schizophrenia or other mental illness -heart disease -if you often drink alcohol -kidney disease -peripheral vascular disease -seizures -stroke -suicidal thoughts, plans, or attempt; a previous suicide attempt by you or a  family member -an unusual or allergic reaction to varenicline, other medicines, foods, dyes, or preservatives -pregnant or trying to get pregnant -breast-feeding How should I use this medicine? Take this medicine by mouth after eating. Take with a full glass of water. Follow the directions on the prescription label. Take your doses at regular intervals. Do not take your medicine more often than directed. There are 3 ways you can use this medicine to help you quit smoking; talk to your health care professional to decide which plan is right for you: 1) you can choose a quit date and start this medicine 1 week before the quit date, or, 2) you can start taking this medicine before you choose a quit date, and then pick a quit date between day 8 and 35 days of treatment, or, 3) if you are not sure that you are able or willing to quit smoking right away, start taking this medicine and slowly decrease the amount you smoke as directed by your health care professional with the goal of being cigarette-free by week 12 of treatment. Stick to your plan; ask about support groups or other ways to help you remain cigarette-free. If you are motivated   to quit smoking and did not succeed during a previous attempt with this medicine for reasons other than side effects, or if you returned to smoking after this treatment, speak with your health care professional about whether another course of this medicine may be right for you. A special MedGuide will be given to you by the pharmacist with each prescription and refill. Be sure to read this information carefully each time. Talk to your pediatrician regarding the use of this medicine in children. This medicine is not approved for use in children. Overdosage: If you think you have taken too much of this medicine contact a poison control center or emergency room at once. NOTE: This medicine is only for you. Do not share this medicine with others. What if I miss a dose? If you  miss a dose, take it as soon as you can. If it is almost time for your next dose, take only that dose. Do not take double or extra doses. What may interact with this medicine? -alcohol or any product that contains alcohol -insulin -other stop smoking aids -theophylline -warfarin This list may not describe all possible interactions. Give your health care provider a list of all the medicines, herbs, non-prescription drugs, or dietary supplements you use. Also tell them if you smoke, drink alcohol, or use illegal drugs. Some items may interact with your medicine. What should I watch for while using this medicine? Visit your doctor or health care professional for regular check ups. Ask for ongoing advice and encouragement from your doctor or healthcare professional, friends, and family to help you quit. If you smoke while on this medication, quit again Your mouth may get dry. Chewing sugarless gum or sucking hard candy, and drinking plenty of water may help. Contact your doctor if the problem does not go away or is severe. You may get drowsy or dizzy. Do not drive, use machinery, or do anything that needs mental alertness until you know how this medicine affects you. Do not stand or sit up quickly, especially if you are an older patient. This reduces the risk of dizzy or fainting spells. Sleepwalking can happen during treatment with this medicine, and can sometimes lead to behavior that is harmful to you, other people, or property. Stop taking this medicine and tell your doctor if you start sleepwalking or have other unusual sleep-related activity. Decrease the amount of alcoholic beverages that you drink during treatment with this medicine until you know if this medicine affects your ability to tolerate alcohol. Some people have experienced increased drunkenness (intoxication), unusual or sometimes aggressive behavior, or no memory of things that have happened (amnesia) during treatment with this  medicine. The use of this medicine may increase the chance of suicidal thoughts or actions. Pay special attention to how you are responding while on this medicine. Any worsening of mood, or thoughts of suicide or dying should be reported to your health care professional right away. What side effects may I notice from receiving this medicine? Side effects that you should report to your doctor or health care professional as soon as possible: -allergic reactions like skin rash, itching or hives, swelling of the face, lips, tongue, or throat -acting aggressive, being angry or violent, or acting on dangerous impulses -breathing problems -changes in vision -chest pain or chest tightness -confusion, trouble speaking or understanding -new or worsening depression, anxiety, or panic attacks -extreme increase in activity and talking (mania) -fast, irregular heartbeat -feeling faint or lightheaded, falls -fever -pain in legs when   walking -problems with balance, talking, walking -redness, blistering, peeling or loosening of the skin, including inside the mouth -ringing in ears -seeing or hearing things that aren't there (hallucinations) -seizures -sleepwalking -sudden numbness or weakness of the face, arm or leg -thoughts about suicide or dying, or attempts to commit suicide -trouble passing urine or change in the amount of urine -unusual bleeding or bruising -unusually weak or tired Side effects that usually do not require medical attention (report to your doctor or health care professional if they continue or are bothersome): -constipation -headache -nausea, vomiting -strange dreams -stomach gas -trouble sleeping This list may not describe all possible side effects. Call your doctor for medical advice about side effects. You may report side effects to FDA at 1-800-FDA-1088. Where should I keep my medicine? Keep out of the reach of children. Store at room temperature between 15 and 30  degrees C (59 and 86 degrees F). Throw away any unused medicine after the expiration date. NOTE: This sheet is a summary. It may not cover all possible information. If you have questions about this medicine, talk to your doctor, pharmacist, or health care provider.  2018 Elsevier/Gold Standard (2015-07-06 16:14:23)  

## 2018-08-27 LAB — BMP8+EGFR
BUN / CREAT RATIO: 11 (ref 9–23)
BUN: 13 mg/dL (ref 6–24)
CO2: 25 mmol/L (ref 20–29)
CREATININE: 1.21 mg/dL — AB (ref 0.57–1.00)
Calcium: 9.7 mg/dL (ref 8.7–10.2)
Chloride: 102 mmol/L (ref 96–106)
GFR, EST AFRICAN AMERICAN: 57 mL/min/{1.73_m2} — AB (ref >59)
GFR, EST NON AFRICAN AMERICAN: 50 mL/min/{1.73_m2} — AB (ref >59)
Glucose: 83 mg/dL (ref 65–99)
Potassium: 4.3 mmol/L (ref 3.5–5.2)
Sodium: 141 mmol/L (ref 134–144)

## 2018-08-27 LAB — HEMOGLOBIN A1C
Est. average glucose Bld gHb Est-mCnc: 123 mg/dL
Hgb A1c MFr Bld: 5.9 % — ABNORMAL HIGH (ref 4.8–5.6)

## 2018-08-30 ENCOUNTER — Encounter: Payer: Self-pay | Admitting: Internal Medicine

## 2018-08-30 NOTE — Progress Notes (Signed)
Your kidney function has decreased some. Please increase water intake. Your a1c is 5.9, this is great.

## 2018-08-30 NOTE — Progress Notes (Signed)
Subjective:     Patient ID: Alyssa Lozano , female    DOB: 09-08-1961 , 57 y.o.   MRN: [id number]   Chief Complaint  Patient presents with  . Diabetes  . discuss quiting smoking    HPI  Diabetes  She presents for her follow-up diabetic visit. She has type 2 diabetes mellitus. There are no hypoglycemic associated symptoms. There are no diabetic associated symptoms. There are no hypoglycemic complications.  Nicotine Dependence  Presents for follow-up visit. Her urge triggers include company of smokers. The symptoms have been improving. Her first smoke is from 8 to 10 AM. She smokes < 1/2 a pack of cigarettes per day.   SHE IS READY TO QUIT. WELLBUTRIN HAS BEEN INEFFECTIVE. SHE HAS BEEN FEARFUL OF CHANTIX DUE TO POSSIBLE SIDE EFFECTS. RECENTLY FOUND OUT HER DAUGHTER IS PREGNANT WITH TWINS. SHE IS PLANNING TO MOVE TO [place] EARLY 2020. SHE WANTS TO HAVE QUIT PRIOR TO HER MOVE.   Past Medical History:  Diagnosis Date  . Hypertension       Current Outpatient Medications:  .  buPROPion (WELLBUTRIN XL) 150 MG 24 hr tablet, Take 150 mg by mouth daily., Disp: , Rfl:  .  olmesartan-hydrochlorothiazide (BENICAR HCT) 40-12.5 MG tablet, TAKE 1 TABLET BY ORAL ROUTE EVERY DAY, Disp: 90 tablet, Rfl: 2 .  sitaGLIPtin-metformin (JANUMET) 50-500 MG tablet, Take 1 tablet by mouth daily., Disp: , Rfl:  .  varenicline (CHANTIX STARTING MONTH PAK) 0.5 MG X 11 & 1 MG X 42 tablet, Take one 0.5 mg tablet once daily x 3 days, then increase to one 0.5 mg tab 2x/day x 4 days, then increase to one 1 mg tab twice daily., Disp: 53 tablet, Rfl: 0   No Known Allergies   Review of Systems  Constitutional: Negative.   HENT: Negative.   Respiratory: Negative.   Cardiovascular: Negative.   Gastrointestinal: Negative.   Musculoskeletal: Positive for arthralgias (SHE C/O R KNEE PAIN. THERE IS SOME PAIN WITH AMBULATION. DENIES TRAUMA/FALL. SX WORSE UPON AWAKENING. SX IMPROVE AS DAY PROGRESSES. ).   Psychiatric/Behavioral: Negative.      Today's Vitals   08/26/18 1114  BP: 116/78  Pulse: (!) 58  Temp: 98.2 F (36.8 C)  TempSrc: Oral  Weight: 173 lb 6.4 oz (78.7 kg)  Height: 4' 11.5" (1.511 m)   Body mass index is 34.44 kg/m.   Objective:  Physical Exam  Constitutional: She is oriented to person, place, and time. She appears well-developed and well-nourished.  HENT:  Head: Normocephalic and atraumatic.  Eyes: EOM are normal.  Cardiovascular: Normal rate, regular rhythm and normal heart sounds.  Pulmonary/Chest: Effort normal and breath sounds normal.  Musculoskeletal:  R KNEE CREPITUS  Neurological: She is alert and oriented to person, place, and time.  Psychiatric: She has a normal mood and affect.  Nursing note and vitals reviewed.       Assessment And Plan:     1. Uncontrolled type 2 diabetes mellitus with hyperglycemia (HCC)  I WILL CHECK LABS AS LISTED BELOW. SHE IS ENCOURAGED TO INCORPORATE MORE EXERCISE INTO HER DAILY ROUTINE.     - BMP8+EGFR - Hemoglobin A1c  2. Essential hypertension, benign  WELL CONTROLLED. SHE WILL CONTINUE WITH CURRENT MEDS FOR NOW.   3. Cigarette nicotine dependence without complication  WE DISCUSSED THE IMPORTANCE OF SMOKING CESSATION FOR GREATER THAN 3 MINUTES. SHE IS AGREEABLE TO STARTING CHANTIX. POSSIBLE SIDE EFFECTS WERE DISCUSSED WITH THE PATIENT. RX STARTER PACK WAS SENT TO THE PHARMACY. SHE  IS ENCOURAGED TO TAKE WITH FOOD. SHE IS ALSO ENCOURAGED TO AVOID SMOKING BOTH IN HER HOME AND IN HER CAR. SHE IS ENCOURAGED TO PICK A QUIT DATE WITHIN 30 DAYS. SHE WILL RTO IN SIX WEEKS FOR RE-EVALUATION. ALL QUESTIONS WERE ANSWERED TO HER SATISFACTION.   4. Chronic pain of right knee  HER SX ARE LIKELY DUE TO OA. RX TOPICAL COMPOUNDED PAIN CREAM WAS SENT TO [facility]. SHE IS ADVISED TO APPLY TO FRONT/BACK OF KNEE 2-3X DAILY. SHE IS ENCOURAGED TO AVOID USE OF NSAIDS - MOTRIN, IBUPROFEN, ALEVE ETC. SHE IS ADVISED TO USE  TYLENOL IF NEEDED.   5. Colon cancer screening  SHE DOES NOT WISH TO HAVE COLONOSCOPY. SHE IS AGREEABLE TO COLOGUARD. I WILL FAX REQUISITION TO [facility]. SHE IS ENCOURAGED TO COMPLETE THIS BY THANKSGIVING.   - Cologuard  6. Need for vaccination  - Flu Vaccine QUAD 6+ mos PF IM (Fluarix Quad PF)        Maximino Greenland, MD

## 2018-08-31 NOTE — Progress Notes (Signed)
Order has been faxed to lab

## 2018-09-24 ENCOUNTER — Ambulatory Visit: Payer: BLUE CROSS/BLUE SHIELD | Admitting: Internal Medicine

## 2018-10-05 ENCOUNTER — Ambulatory Visit: Payer: BLUE CROSS/BLUE SHIELD | Admitting: Internal Medicine

## 2018-10-05 ENCOUNTER — Encounter: Payer: Self-pay | Admitting: Internal Medicine

## 2018-10-05 VITALS — BP 112/74 | HR 75 | Temp 98.1°F | Ht 59.0 in | Wt 174.2 lb

## 2018-10-05 DIAGNOSIS — M545 Low back pain, unspecified: Secondary | ICD-10-CM

## 2018-10-05 DIAGNOSIS — Z6835 Body mass index (BMI) 35.0-35.9, adult: Secondary | ICD-10-CM | POA: Diagnosis not present

## 2018-10-05 DIAGNOSIS — F17210 Nicotine dependence, cigarettes, uncomplicated: Secondary | ICD-10-CM | POA: Diagnosis not present

## 2018-10-05 DIAGNOSIS — I10 Essential (primary) hypertension: Secondary | ICD-10-CM | POA: Insufficient documentation

## 2018-10-05 MED ORDER — CYCLOBENZAPRINE HCL 10 MG PO TABS: ORAL_TABLET | ORAL | 0 refills | Status: DC

## 2018-10-05 NOTE — Progress Notes (Signed)
Subjective:     Patient ID: Alyssa Lozano , female    DOB: 1961-03-16 , 57 y.o.   MRN: [id number]   Chief Complaint  Patient presents with  . Chantix f/u    HPI  She is here today for f/u smoking cessation. She was prescribed Chantix to help her with this. Unfortunately,she did not start the medication. She reports that she was scared to take it, and her friends were scared for her to take it as well.     Past Medical History:  Diagnosis Date  . Hypertension      Family History  Problem Relation Age of Onset  . Hypertension Mother   . Hypertension Father      Current Outpatient Medications:  .  buPROPion (WELLBUTRIN XL) 150 MG 24 hr tablet, Take 150 mg by mouth daily., Disp: , Rfl:  .  olmesartan-hydrochlorothiazide (BENICAR HCT) 40-12.5 MG tablet, TAKE 1 TABLET BY ORAL ROUTE EVERY DAY, Disp: 90 tablet, Rfl: 2 .  sitaGLIPtin-metformin (JANUMET) 50-500 MG tablet, Take 1 tablet by mouth daily., Disp: , Rfl:  .  cyclobenzaprine (FLEXERIL) 10 MG tablet, One tab po qhs prn back pain, Disp: 30 tablet, Rfl: 0 .  varenicline (CHANTIX STARTING MONTH PAK) 0.5 MG X 11 & 1 MG X 42 tablet, Take one 0.5 mg tablet once daily x 3 days, then increase to one 0.5 mg tab 2x/day x 4 days, then increase to one 1 mg tab twice daily. (Patient not taking: Reported on 10/05/2018), Disp: 53 tablet, Rfl: 0   No Known Allergies   Review of Systems  Constitutional: Negative.   Respiratory: Negative.   Cardiovascular: Negative.   Gastrointestinal: Negative.   Musculoskeletal: Positive for back pain (she states she hurt left side of her back. ).  Neurological: Negative.   Psychiatric/Behavioral: Negative.      Today's Vitals   10/05/18 1422  BP: 112/74  Pulse: 75  Temp: 98.1 F (36.7 C)  TempSrc: Oral  Weight: 174 lb 3.2 oz (79 kg)  Height: 4\' 11"  (1.499 m)  PainSc: 4   PainLoc: Generalized   Body mass index is 35.18 kg/m.   Objective:  Physical Exam  Constitutional: She is oriented to  person, place, and time. She appears well-developed and well-nourished.  HENT:  Head: Normocephalic and atraumatic.  Eyes: EOM are normal.  Cardiovascular: Normal rate, regular rhythm and normal heart sounds.  Pulmonary/Chest: Effort normal and breath sounds normal.  Neurological: She is alert and oriented to person, place, and time.  Psychiatric: She has a normal mood and affect.  Nursing note and vitals reviewed.       Assessment And Plan:     1. Cigarette nicotine dependence without complication  We talked at length regarding pros/cons of smoking cessation. She has been on wellbutrin, but has yet to quit. She was congratulated for cutting back to no more than four cigs per day. She agrees to start Chantix, 0.5mg  once daily x 14 days. At that point, I plan to increase her to bid dosing. Pt advised that I can titrate her slower than what is typically done. She is comfortable with this treatment plan. She will rto in four weeks for re-evaluation. She is encouraged to contact me ASAP should she experience nightmares and/or change in mood. All questions were answered to her satisfaction.   2. Acute left-sided low back pain without sciatica  She was given rx cyclobenzaprine 10mg  nightly to use as needed. She will let me know if her  sx persist.   3. Class 2 severe obesity due to excess calories with serious comorbidity and body mass index (BMI) of 35.0 to 35.9 in adult [facility])  She is encouraged to strive for BMI less than 30 to decrease cardiac risk. She is advised to exercise 30 minutes four to five days weekly.         Gwynneth Aliment, MD

## 2018-10-27 ENCOUNTER — Telehealth: Payer: Self-pay

## 2018-10-27 NOTE — Telephone Encounter (Signed)
Pt notified that she needed to complete her cologaurd kit before December 31st.

## 2018-10-27 NOTE — Telephone Encounter (Signed)
Left the pt a message to call the office back. I was calling the pt to let her know that Dr. Baird Cancer said that she needed to complete her cologaurd kit before December 21st.

## 2018-11-05 ENCOUNTER — Ambulatory Visit: Payer: BLUE CROSS/BLUE SHIELD | Admitting: Internal Medicine

## 2018-11-06 ENCOUNTER — Other Ambulatory Visit: Payer: Self-pay | Admitting: Nurse Practitioner

## 2019-02-08 ENCOUNTER — Telehealth: Payer: Self-pay | Admitting: Internal Medicine

## 2019-02-08 NOTE — Telephone Encounter (Signed)
Patient has agreed to do a virtual visit on 02/10/19 with Dr. Allyne Gee

## 2019-02-10 ENCOUNTER — Other Ambulatory Visit: Payer: Self-pay

## 2019-02-10 ENCOUNTER — Ambulatory Visit: Payer: BLUE CROSS/BLUE SHIELD | Admitting: Internal Medicine

## 2019-02-10 ENCOUNTER — Encounter: Payer: Self-pay | Admitting: Internal Medicine

## 2019-02-10 VITALS — Temp 97.2°F | Ht 59.0 in | Wt 165.2 lb

## 2019-02-10 DIAGNOSIS — F17210 Nicotine dependence, cigarettes, uncomplicated: Secondary | ICD-10-CM | POA: Diagnosis not present

## 2019-02-10 DIAGNOSIS — E1165 Type 2 diabetes mellitus with hyperglycemia: Secondary | ICD-10-CM | POA: Diagnosis not present

## 2019-02-10 DIAGNOSIS — Z7189 Other specified counseling: Secondary | ICD-10-CM

## 2019-02-10 DIAGNOSIS — E6609 Other obesity due to excess calories: Secondary | ICD-10-CM

## 2019-02-10 DIAGNOSIS — Z6833 Body mass index (BMI) 33.0-33.9, adult: Secondary | ICD-10-CM

## 2019-02-10 DIAGNOSIS — W1781XA Fall down embankment (hill), initial encounter: Secondary | ICD-10-CM

## 2019-02-10 DIAGNOSIS — I10 Essential (primary) hypertension: Secondary | ICD-10-CM

## 2019-02-10 MED ORDER — OLMESARTAN MEDOXOMIL-HCTZ 40-12.5 MG PO TABS: ORAL_TABLET | ORAL | 1 refills | Status: DC

## 2019-02-10 MED ORDER — SITAGLIPTIN PHOS-METFORMIN HCL 50-500 MG PO TABS: 1.0000 | ORAL_TABLET | Freq: Every day | ORAL | 1 refills | Status: AC

## 2019-02-10 NOTE — Progress Notes (Addendum)
Virtual Visit via Video Note   This visit type was conducted due to national recommendations for restrictions regarding the COVID-19 Pandemic (e.g. social distancing).  This format is felt to be most appropriate for this patient at this time.  All issues noted in this document were discussed and addressed.  No physical exam was performed (except for noted visual exam findings with Video Visits).  Please refer to the patient's chart (MyChart message for video visits and phone note for telephone visits) for the patient's consent to telehealth for [facility].  Date:  02/10/2019   ID:  Alyssa Lozano, DOB 16-Sep-1961, MRN [id number]  Patient Location:  Home in [place]  Provider location:   Office    Chief Complaint:  Chantix f/u  History of Present Illness:    Alyssa Lozano is a 58 y.o. female who presents via video conferencing for a telehealth visit today.    The patient does not have symptoms concerning for COVID-19 infection (fever, chills, cough, or new shortness of breath).   She has requested a virtual visit today for f/u smoking cessation. She is in [place] with her daughter, who is scheduled to deliver twins next month. She admits that she did not start the Chantix. She lives with her daughter in a community that does not allow smoking. She reports she has to walk about 2 miles before she gets to a smoking area. She adds that she has been walking more because she walks the dogs. She feels great.   She does add that she slipped on a muddy hill yesterday while walking the dogs.  She admits that she went off the walking trail and thought she would explore the area. She fell down and hit her hip. She feels fine today. She is able to ambulate without any difficulty.     Past Medical History:  Diagnosis Date  . Hypertension    History reviewed. No pertinent surgical history.   Current Meds  Medication Sig  . buPROPion (WELLBUTRIN XL) 150 MG 24 hr tablet TAKE 2 TABLETS BY MOUTH EVERY  DAY  . olmesartan-hydrochlorothiazide (BENICAR HCT) 40-12.5 MG tablet TAKE 1 TABLET BY ORAL ROUTE EVERY DAY  . sitaGLIPtin-metformin (JANUMET) 50-500 MG tablet Take 1 tablet by mouth daily.  . [DISCONTINUED] cyclobenzaprine (FLEXERIL) 10 MG tablet One tab po qhs prn back pain  . [DISCONTINUED] olmesartan-hydrochlorothiazide (BENICAR HCT) 40-12.5 MG tablet TAKE 1 TABLET BY ORAL ROUTE EVERY DAY  . [DISCONTINUED] sitaGLIPtin-metformin (JANUMET) 50-500 MG tablet Take 1 tablet by mouth daily.     Allergies:   Patient has no known allergies.   Social History   Tobacco Use  . Smoking status: Current Every Day Smoker    Packs/day: 0.50    Years: 16.00    Pack years: 8.00  . Smokeless tobacco: Never Used  Substance Use Topics  . Alcohol use: No  . Drug use: No     Family Hx: The patient's family history includes Hypertension in her father and mother.  ROS:   Please see the history of present illness.    Review of Systems  Constitutional: Negative.   Respiratory: Negative.   Cardiovascular: Negative.   Gastrointestinal: Negative.   Neurological: Negative.   Psychiatric/Behavioral: Negative.     All other systems reviewed and are negative.   Labs/Other Tests and Data Reviewed:    Recent Labs: 05/20/2018: ALT 10; Hemoglobin 11.2; Platelets 290 08/26/2018: BUN 13; Creatinine, Ser 1.21; Potassium 4.3; Sodium 141   Recent Lipid Panel Lab Results  Component Value Date/Time   CHOL 209 (A) 05/20/2018   TRIG 98 05/20/2018   HDL 44 05/20/2018   LDLCALC 145 05/20/2018    Wt Readings from Last 3 Encounters:  02/10/19 165 lb 3.2 oz (74.9 kg)  10/05/18 174 lb 3.2 oz (79 kg)  08/26/18 173 lb 6.4 oz (78.7 kg)     Exam:    Vital Signs:  Temp (!) 97.2 F (36.2 C) (Oral) Comment: pt provided Comment (Src): pt provided  Ht 4\' 11"  (1.499 m)   Wt 165 lb 3.2 oz (74.9 kg) Comment: pt provided  BMI 33.37 kg/m     Physical Exam  Nursing note and vitals reviewed. PE not performed  since she could not get her camera to work.   ASSESSMENT & PLAN:    1.  1. Cigarette nicotine dependence without complication  She admits that she did not start the Chantix as prescribed. She does not think she needs it. She will continue with Wellbutrin.   2. Fall down hill, initial encounter  This occurred yesterday. She does not report any injuries.   3. Uncontrolled type 2 diabetes mellitus with hyperglycemia (HCC)  She will continue with current meds for now. [place] She agrees to f/u at that time. Refills of Janumet was sent to the pharmacy.   4. Essential hypertension, benign  She will continue with current meds. She is encouraged to avoid adding salt to her foods.   5. Class 1 obesity due to excess calories with serious comorbidity and body mass index (BMI) of 33.0 to 33.9 in adult  She was congratulated on her 9 pound weight loss. Importance of achieving optimal weight to decrease risk of cardiovascular disease and cancers was discussed with the patient in full detail. She is encouraged to start slowly - start with 10 minutes twice daily at least three to four days per week and to gradually build to 30 minutes five days weekly. She was given tips to incorporate more activity into her daily routine - take stairs when possible, park farther away from grocery stores, etc.       COVID-19 Education: The signs and symptoms of COVID-19 were discussed with the patient and how to seek care for testing (follow up with PCP or arrange E-visit).  The importance of social distancing was discussed today.  Patient Risk:   After full review of this patients clinical status, I feel that they are at least moderate risk at this time.  Time:   Today, I have spent 16 minutes 13 minutes with the patient with telehealth technology discussing above diagnoses.  This was a failed virtual visit, she did log in appropriately; however, could not get the camera on her to  face her. Therefore, this was a telephone visit.    Medication Adjustments/Labs and Tests Ordered: Current medicines are reviewed at length with the patient today.  Concerns regarding medicines are outlined above.  Tests Ordered: No orders of the defined types were placed in this encounter.  Medication Changes: Meds ordered this encounter  Medications  . olmesartan-hydrochlorothiazide (BENICAR HCT) 40-12.5 MG tablet    Sig: TAKE 1 TABLET BY ORAL ROUTE EVERY DAY    Dispense:  90 tablet    Refill:  1  . sitaGLIPtin-metformin (JANUMET) 50-500 MG tablet    Sig: Take 1 tablet by mouth daily.    Dispense:  90 tablet    Refill:  1    Disposition:  Follow up in  3 month(s)  Signed, Gwynneth Aliment, MD

## 2019-02-18 ENCOUNTER — Telehealth: Payer: Self-pay

## 2019-02-18 NOTE — Telephone Encounter (Signed)
I called pt to see If she has completed the kit for her cologuard and she said no and that she is not going to I told her I would inform you. YRL,RMA

## 2019-06-08 ENCOUNTER — Other Ambulatory Visit: Payer: Self-pay

## 2019-06-08 MED ORDER — OLMESARTAN MEDOXOMIL-HCTZ 40-12.5 MG PO TABS: ORAL_TABLET | ORAL | 1 refills | Status: AC

## 2019-06-21 ENCOUNTER — Encounter: Payer: BLUE CROSS/BLUE SHIELD | Admitting: Internal Medicine
